# Patient Record
Sex: Female | Born: 1950 | Race: White | Hispanic: No | Smoking: Never smoker
Health system: Southern US, Community
[De-identification: ages and names within clinical notes are randomized; demographics above are authoritative.]

## PROBLEM LIST (undated history)

## (undated) DIAGNOSIS — R1310 Dysphagia, unspecified: Secondary | ICD-10-CM

## (undated) DIAGNOSIS — K449 Diaphragmatic hernia without obstruction or gangrene: Secondary | ICD-10-CM

## (undated) DIAGNOSIS — K649 Unspecified hemorrhoids: Secondary | ICD-10-CM

## (undated) DIAGNOSIS — K219 Gastro-esophageal reflux disease without esophagitis: Secondary | ICD-10-CM

## (undated) DIAGNOSIS — M1990 Unspecified osteoarthritis, unspecified site: Secondary | ICD-10-CM

## (undated) DIAGNOSIS — E039 Hypothyroidism, unspecified: Secondary | ICD-10-CM

## (undated) DIAGNOSIS — K5790 Diverticulosis of intestine, part unspecified, without perforation or abscess without bleeding: Secondary | ICD-10-CM

## (undated) DIAGNOSIS — M5430 Sciatica, unspecified side: Secondary | ICD-10-CM

## (undated) HISTORY — DX: Diverticulosis of intestine, part unspecified, without perforation or abscess without bleeding: K57.90

## (undated) HISTORY — DX: Diaphragmatic hernia without obstruction or gangrene: K44.9

## (undated) HISTORY — DX: Sciatica, unspecified side: M54.30

## (undated) HISTORY — DX: Unspecified osteoarthritis, unspecified site: M19.90

## (undated) HISTORY — DX: Gastro-esophageal reflux disease without esophagitis: K21.9

## (undated) HISTORY — DX: Dysphagia, unspecified: R13.10

## (undated) HISTORY — PX: ECTOPIC PREGNANCY SURGERY: SHX613

## (undated) HISTORY — PX: TUBAL LIGATION: SHX77

## (undated) HISTORY — DX: Hypothyroidism, unspecified: E03.9

## (undated) HISTORY — DX: Unspecified hemorrhoids: K64.9

---

## 2001-04-30 ENCOUNTER — Encounter: Admission: RE | Admit: 2001-04-30 | Discharge: 2001-04-30 | Payer: Self-pay | Admitting: *Deleted

## 2001-04-30 ENCOUNTER — Encounter: Payer: Self-pay | Admitting: *Deleted

## 2001-05-30 ENCOUNTER — Encounter (INDEPENDENT_AMBULATORY_CARE_PROVIDER_SITE_OTHER): Payer: Self-pay | Admitting: Specialist

## 2001-05-30 ENCOUNTER — Ambulatory Visit (HOSPITAL_COMMUNITY): Admission: RE | Admit: 2001-05-30 | Discharge: 2001-05-30 | Payer: Self-pay | Admitting: *Deleted

## 2005-12-12 DIAGNOSIS — K649 Unspecified hemorrhoids: Secondary | ICD-10-CM

## 2005-12-12 DIAGNOSIS — K5790 Diverticulosis of intestine, part unspecified, without perforation or abscess without bleeding: Secondary | ICD-10-CM

## 2005-12-12 HISTORY — DX: Unspecified hemorrhoids: K64.9

## 2005-12-12 HISTORY — DX: Diverticulosis of intestine, part unspecified, without perforation or abscess without bleeding: K57.90

## 2006-02-06 ENCOUNTER — Other Ambulatory Visit: Admission: RE | Admit: 2006-02-06 | Discharge: 2006-02-06 | Payer: Self-pay | Admitting: Family Medicine

## 2007-03-19 ENCOUNTER — Other Ambulatory Visit: Admission: RE | Admit: 2007-03-19 | Discharge: 2007-03-19 | Payer: Self-pay | Admitting: Family Medicine

## 2008-08-11 ENCOUNTER — Other Ambulatory Visit: Admission: RE | Admit: 2008-08-11 | Discharge: 2008-08-11 | Payer: Self-pay | Admitting: Family Medicine

## 2010-10-19 ENCOUNTER — Ambulatory Visit (HOSPITAL_COMMUNITY): Admission: RE | Admit: 2010-10-19 | Discharge: 2010-10-19 | Payer: Self-pay | Admitting: Family Medicine

## 2011-04-29 NOTE — Procedures (Signed)
[facility]  Patient:    Tina Mckinney, Tina Mckinney                     MRN: [id number] Proc. Date: 05/30/01 Adm. Date:  [id number] Attending:  Sabino Gasser CC:         Arlana Lindau, M.D.  Katherine Roan, M.D.   Procedure Report  ADDENDUM:  Send cc of original report to [facility] listed in header. DD:  05/30/01 TD:  05/30/01 Job: [id number]

## 2011-04-29 NOTE — Procedures (Signed)
[facility]  Patient:    Tina Mckinney, Tina Mckinney                     MRN: [id number] Proc. Date: 05/30/01 Adm. Date:  [id number] Attending:  Sabino Gasser                           Procedure Report  PROCEDURE:  Upper endoscopy.  INDICATION FOR PROCEDURE:  Reflux symptomatology.  ANESTHESIA:  Demerol 70, Versed 7 mg.  DESCRIPTION OF PROCEDURE:  With the patient mildly sedated in the left lateral decubitus position, the Olympus videoscopic endoscope was inserted in the mouth and passed under direct vision through the esophagus. There was a question of Barretts esophagus seen, photographed and biopsied. We entered into the stomach and there was a moderately enlarged hiatal hernia we saw and photographed. We advanced into the stomach, the fundus, body, antrum, duodenal bulb and second portion of the duodenum all were well visualized.  From this point, the endoscope was slowly withdrawn taking circumferential views of the entire duodenal mucosa until the endoscope was then pulled back into the stomach, placed in retroflexion to view the stomach from below and once again a hiatal hernia was seen.  The endoscope was straightened and withdrawn, taking circumferential views of the entire gastric and esophageal mucosa which otherwise appeared normal. The patients vital signs and pulse oximeter remained stable. The patient tolerated the procedure well and there were no apparent complications.  FINDINGS:  Hiatal hernia with question of Barretts esophagus. Await biopsy report. The patient will call me for results followup with me in as an outpatient. DD:  05/30/01 TD:  05/30/01 Job: [id number]

## 2011-09-14 ENCOUNTER — Other Ambulatory Visit (HOSPITAL_COMMUNITY): Payer: Self-pay | Admitting: Family Medicine

## 2011-10-05 ENCOUNTER — Other Ambulatory Visit (HOSPITAL_COMMUNITY): Payer: Self-pay | Admitting: Family Medicine

## 2011-10-05 DIAGNOSIS — Z1231 Encounter for screening mammogram for malignant neoplasm of breast: Secondary | ICD-10-CM

## 2011-10-27 ENCOUNTER — Ambulatory Visit (HOSPITAL_COMMUNITY)
Admission: RE | Admit: 2011-10-27 | Discharge: 2011-10-27 | Disposition: A | Discharge status: Home / Self Care | Payer: 59 | Source: Ambulatory Visit | Attending: Family Medicine | Admitting: Family Medicine

## 2011-10-27 DIAGNOSIS — Z1231 Encounter for screening mammogram for malignant neoplasm of breast: Secondary | ICD-10-CM

## 2012-04-12 ENCOUNTER — Other Ambulatory Visit: Payer: Self-pay

## 2012-04-12 ENCOUNTER — Other Ambulatory Visit: Payer: Self-pay | Admitting: Family Medicine

## 2012-04-12 ENCOUNTER — Other Ambulatory Visit (HOSPITAL_COMMUNITY)
Admission: RE | Admit: 2012-04-12 | Discharge: 2012-04-12 | Disposition: A | Discharge status: Home / Self Care | Payer: 59 | Source: Ambulatory Visit | Attending: Family Medicine | Admitting: Family Medicine

## 2012-04-12 DIAGNOSIS — R2232 Localized swelling, mass and lump, left upper limb: Secondary | ICD-10-CM

## 2012-04-12 DIAGNOSIS — Z01419 Encounter for gynecological examination (general) (routine) without abnormal findings: Secondary | ICD-10-CM | POA: Insufficient documentation

## 2012-04-19 ENCOUNTER — Ambulatory Visit
Admission: RE | Admit: 2012-04-19 | Discharge: 2012-04-19 | Disposition: A | Discharge status: Home / Self Care | Payer: PRIVATE HEALTH INSURANCE | Source: Ambulatory Visit | Attending: Family Medicine | Admitting: Family Medicine

## 2012-04-19 ENCOUNTER — Other Ambulatory Visit: Payer: Self-pay | Admitting: Family Medicine

## 2012-04-19 DIAGNOSIS — R2232 Localized swelling, mass and lump, left upper limb: Secondary | ICD-10-CM

## 2013-04-12 ENCOUNTER — Other Ambulatory Visit: Payer: Self-pay | Admitting: Family Medicine

## 2013-04-12 ENCOUNTER — Other Ambulatory Visit (HOSPITAL_COMMUNITY)
Admission: RE | Admit: 2013-04-12 | Discharge: 2013-04-12 | Disposition: A | Discharge status: Home / Self Care | Payer: 59 | Source: Ambulatory Visit | Attending: Family Medicine | Admitting: Family Medicine

## 2013-04-12 DIAGNOSIS — Z0000 Encounter for general adult medical examination without abnormal findings: Secondary | ICD-10-CM | POA: Insufficient documentation

## 2013-04-12 DIAGNOSIS — Z1231 Encounter for screening mammogram for malignant neoplasm of breast: Secondary | ICD-10-CM

## 2013-05-10 ENCOUNTER — Other Ambulatory Visit: Payer: Self-pay | Admitting: Gastroenterology

## 2013-05-10 DIAGNOSIS — K219 Gastro-esophageal reflux disease without esophagitis: Secondary | ICD-10-CM

## 2013-05-14 ENCOUNTER — Ambulatory Visit
Admission: RE | Admit: 2013-05-14 | Discharge: 2013-05-14 | Disposition: A | Discharge status: Home / Self Care | Payer: 59 | Source: Ambulatory Visit | Attending: Gastroenterology | Admitting: Gastroenterology

## 2013-05-14 DIAGNOSIS — K219 Gastro-esophageal reflux disease without esophagitis: Secondary | ICD-10-CM

## 2013-05-23 ENCOUNTER — Ambulatory Visit
Admission: RE | Admit: 2013-05-23 | Discharge: 2013-05-23 | Disposition: A | Discharge status: Home / Self Care | Payer: 59 | Source: Ambulatory Visit | Attending: Family Medicine | Admitting: Family Medicine

## 2013-05-23 DIAGNOSIS — Z1231 Encounter for screening mammogram for malignant neoplasm of breast: Secondary | ICD-10-CM

## 2014-04-18 ENCOUNTER — Other Ambulatory Visit (HOSPITAL_COMMUNITY)
Admission: RE | Admit: 2014-04-18 | Discharge: 2014-04-18 | Disposition: A | Discharge status: Home / Self Care | Payer: 59 | Source: Ambulatory Visit | Attending: Family Medicine | Admitting: Family Medicine

## 2014-04-18 ENCOUNTER — Other Ambulatory Visit: Payer: Self-pay | Admitting: Family Medicine

## 2014-04-18 DIAGNOSIS — Z0000 Encounter for general adult medical examination without abnormal findings: Secondary | ICD-10-CM | POA: Insufficient documentation

## 2014-05-08 ENCOUNTER — Other Ambulatory Visit (HOSPITAL_COMMUNITY): Payer: Self-pay | Admitting: Family Medicine

## 2014-05-08 DIAGNOSIS — Z1231 Encounter for screening mammogram for malignant neoplasm of breast: Secondary | ICD-10-CM

## 2014-05-29 ENCOUNTER — Ambulatory Visit
Admission: RE | Admit: 2014-05-29 | Discharge: 2014-05-29 | Disposition: A | Discharge status: Home / Self Care | Payer: 59 | Source: Ambulatory Visit | Attending: Family Medicine | Admitting: Family Medicine

## 2014-05-29 ENCOUNTER — Other Ambulatory Visit (HOSPITAL_COMMUNITY): Payer: Self-pay | Admitting: Family Medicine

## 2014-05-29 ENCOUNTER — Ambulatory Visit (HOSPITAL_COMMUNITY): Payer: 59

## 2014-05-29 DIAGNOSIS — Z1231 Encounter for screening mammogram for malignant neoplasm of breast: Secondary | ICD-10-CM

## 2014-07-11 ENCOUNTER — Other Ambulatory Visit: Payer: Self-pay | Admitting: Family Medicine

## 2014-07-11 ENCOUNTER — Ambulatory Visit
Admission: RE | Admit: 2014-07-11 | Discharge: 2014-07-11 | Disposition: A | Discharge status: Home / Self Care | Payer: 59 | Source: Ambulatory Visit | Attending: Family Medicine | Admitting: Family Medicine

## 2014-07-11 DIAGNOSIS — R059 Cough, unspecified: Secondary | ICD-10-CM

## 2014-07-11 DIAGNOSIS — R05 Cough: Secondary | ICD-10-CM

## 2015-07-03 ENCOUNTER — Other Ambulatory Visit (HOSPITAL_COMMUNITY): Payer: Self-pay | Admitting: Family Medicine

## 2015-07-03 DIAGNOSIS — Z1231 Encounter for screening mammogram for malignant neoplasm of breast: Secondary | ICD-10-CM

## 2015-07-16 ENCOUNTER — Ambulatory Visit (HOSPITAL_COMMUNITY): Payer: 59

## 2015-07-30 ENCOUNTER — Ambulatory Visit (HOSPITAL_COMMUNITY)
Admission: RE | Admit: 2015-07-30 | Discharge: 2015-07-30 | Disposition: A | Discharge status: Home / Self Care | Payer: 59 | Source: Ambulatory Visit | Attending: Family Medicine | Admitting: Family Medicine

## 2015-07-30 DIAGNOSIS — Z1231 Encounter for screening mammogram for malignant neoplasm of breast: Secondary | ICD-10-CM

## 2016-04-22 ENCOUNTER — Other Ambulatory Visit: Payer: Self-pay | Admitting: Family Medicine

## 2016-04-22 ENCOUNTER — Other Ambulatory Visit (HOSPITAL_COMMUNITY)
Admission: RE | Admit: 2016-04-22 | Discharge: 2016-04-22 | Disposition: A | Discharge status: Home / Self Care | Payer: BLUE CROSS/BLUE SHIELD | Source: Ambulatory Visit | Attending: Family Medicine | Admitting: Family Medicine

## 2016-04-22 DIAGNOSIS — Z01419 Encounter for gynecological examination (general) (routine) without abnormal findings: Secondary | ICD-10-CM | POA: Diagnosis not present

## 2016-04-26 LAB — CYTOLOGY - PAP

## 2016-07-29 ENCOUNTER — Other Ambulatory Visit: Payer: Self-pay | Admitting: Family Medicine

## 2016-07-29 DIAGNOSIS — Z1231 Encounter for screening mammogram for malignant neoplasm of breast: Secondary | ICD-10-CM

## 2016-08-08 ENCOUNTER — Ambulatory Visit
Admission: RE | Admit: 2016-08-08 | Discharge: 2016-08-08 | Disposition: A | Discharge status: Home / Self Care | Payer: BLUE CROSS/BLUE SHIELD | Source: Ambulatory Visit | Attending: Family Medicine | Admitting: Family Medicine

## 2016-08-08 DIAGNOSIS — Z1231 Encounter for screening mammogram for malignant neoplasm of breast: Secondary | ICD-10-CM

## 2016-11-21 ENCOUNTER — Ambulatory Visit (INDEPENDENT_AMBULATORY_CARE_PROVIDER_SITE_OTHER): Payer: BLUE CROSS/BLUE SHIELD | Admitting: Neurology

## 2016-11-21 ENCOUNTER — Encounter: Payer: Self-pay | Admitting: Neurology

## 2016-11-21 VITALS — BP 160/86 | HR 74 | Resp 20 | Ht 62.0 in | Wt 228.0 lb

## 2016-11-21 DIAGNOSIS — R42 Dizziness and giddiness: Secondary | ICD-10-CM

## 2016-11-21 DIAGNOSIS — R2689 Other abnormalities of gait and mobility: Secondary | ICD-10-CM

## 2016-11-21 DIAGNOSIS — H519 Unspecified disorder of binocular movement: Secondary | ICD-10-CM

## 2016-11-21 NOTE — Progress Notes (Signed)
Subjective:    Patient ID: Tina GuardianCynthia W Mckinney is a 65 y.o. female.  HPI     Huston FoleySaima Brenen Beigel, MD, PhD [facility] [address], [address] [address] [place], [place] [id number]  Dear Dr. Emily FilbertGould,  I saw your patient, Tina JewelCynthia Vokes, upon your kind request in my neurologic clinic today for initial consultation of her vertigo. The patient is accompanied by her friend, Annette StableBill, today. As you know, Ms. Tina Mckinney is a 65 year old right-handed woman with an underlying medical history of hiatal hernia, hemorrhoids, reflux disease, diverticulosis, and obesity, who reports difficulty with involuntary eye movements, associated with feeling of nausea and feeling of vertigo. Symptoms are intermittent. She had 3 episodes in the past year. Episodes are few minutes long, up to 15 minutes. She has been noted to have jumpy eye movements, rotating eye movements and describes room spinning or rotating, or the picture in front of her eyes appears to be jumping up and down. She has had some falls. She feels off-balance. Her original symptoms started in 2009 and she saw Dr. Sandria ManlyLove in this office at the time. She reports that she had a brain MRI which was negative at the time. She also [place]. She reports that she saw a total of 5 doctors at the time and did not have a clear answer to her symptoms. She was questioning at one point whether she had carbon monoxide poisoning. I believe, Dr. Loleta ChanceHill recommended ambulatory EEG but she did not have it done, she lost insurance for a while. She does not have daily symptoms, currently she does not have any vertiginous symptoms or headaches. She does not typically have any headaches but feels that her head swells up at times. She has a remote history of skull fracture at age 89. She has a sebaceous cyst on her right side of the head but it does not bother her. She denies snoring or apneas, likes to sleep on her stomach. She lives alone. She does  not drink water very much, about 3-4 glasses per day, typically 2 cups of coffee in the morning and sodas infrequently and tea infrequently, no alcohol, does not smoke. She has not had any outpatient physical therapy. I reviewed your office note from 11/11/2016. 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 More recently, she has had these visual symptoms. Your examination was in keeping with cataracts and decrease in visual acuity secondary to dense cataracts. She was not found to have any nystagmus, APD or visual field loss.  She also brings in a list of symptoms, she has experienced over the last years, since 2009, including shortness of breath, fatigue, malaise, dizziness, clumsiness, confusion, slurring of speech, nausea, chest pain, unusual emotional behavior and mood swings.   Her Past Medical History Is Significant For: Past Medical History:  Diagnosis Date  . Arthritis   . Diverticulosis 2007  . Dysphagia   . GERD (gastroesophageal reflux disease)   . Hemorrhoid 2007  . Hiatal hernia   . Hypothyroid   . Sciatica     Her Past Surgical History Is Significant For: Past Surgical History:  Procedure Laterality Date  . ECTOPIC PREGNANCY SURGERY    . TUBAL LIGATION      Her Family History Is Significant For: Family History  Problem Relation Age of Onset  . Heart failure Mother   . Hypertension Father   . Heart disease Father   .  Breast cancer Maternal Grandmother     Her Social History Is Significant For: Social History   Social History  . Marital status: Divorced    Spouse name: N/A  . Number of children: N/A  . Years of education: N/A   Social History Main Topics  . Smoking status: Never Smoker  . Smokeless tobacco: Never Used  . Alcohol use 1.2 oz/week    2 Standard drinks or equivalent per week  . Drug use: No  . Sexual activity: Not Asked   Other Topics Concern  . None   Social History Narrative  . None     Her Allergies Are:  No Known Allergies:   Her Current Medications Are:  Outpatient Encounter Prescriptions as of 11/21/2016  Medication Sig  . Cholecalciferol (VITAMIN D PO) Take by mouth.  . cyanocobalamin 2000 MCG tablet Take 2,000 mcg by mouth daily.  Marland Kitchen GLUCOSAMINE HCL PO Take by mouth.  . Glucosamine-Chondroitin (MOVE FREE PO) Take 2 tablets by mouth daily.  Marland Kitchen levothyroxine (SYNTHROID, LEVOTHROID) 75 MCG tablet Take 75 mcg by mouth daily.  . Magnesium 500 MG TABS Take 500 mg by mouth daily.  . naproxen sodium (ANAPROX) 220 MG tablet Take 660 mg by mouth 2 (two) times daily with a meal.  . [DISCONTINUED] B Complex Vitamins (VITAMIN B COMPLEX PO) Take by mouth.  . [DISCONTINUED] BIOTIN PO Take by mouth.  . [DISCONTINUED] esomeprazole (NEXIUM) 40 MG capsule Take 40 mg by mouth daily at 12 noon.  . [DISCONTINUED] GARCINIA CAMBOGIA-CHROMIUM PO Take by mouth.  . [DISCONTINUED] THYROID, PORK, PO Take by mouth.   No facility-administered encounter medications on file as of 11/21/2016.   : Review of Systems:  Out of a complete 14 point review of systems, all are reviewed and negative with the exception of these symptoms as listed below:  Review of Systems  Neurological: Positive for dizziness, speech difficulty, weakness and numbness.       Pt presents today with her significant other to discuss symptoms that have been present since 2009. Symptoms include dizziness, vertigo, and "rapid eye movements". Pt says that she feels "drunk" all of the time.    Objective:  Neurologic Exam  Physical Exam Physical Examination:   Vitals:   11/21/16 1011  BP: (!) 160/86  Pulse: 74  Resp: 20   General Examination: The patient is a very pleasant 65 y.o. female in no acute distress. She appears well-developed and well-nourished and well groomed.   HEENT: Normocephalic, atraumatic, pupils are equal, round and reactive to light and accommodation. Funduscopic exam is normal with sharp disc  margins noted. Extraocular tracking is good without limitation to gaze excursion or nystagmus noted. Normal smooth pursuit is noted. Hearing is grossly intact. Face is symmetric with normal facial animation and normal facial sensation. Speech is clear with no dysarthria noted. There is no hypophonia. There is no lip, neck/head, jaw or voice tremor. Neck is supple with full range of passive and active motion. There are no carotid bruits on auscultation. Oropharynx exam reveals: mild mouth dryness, adequate dental hygiene and mild airway crowding, due to smaller airway entry. Mallampati is class II. Tongue protrudes centrally and palate elevates symmetrically. Tonsils are absent. Denies lightheadedness or vertiginous symptoms.   Chest: Clear to auscultation without wheezing, rhonchi or crackles noted.  Heart: S1+S2+0, regular and normal without murmurs, rubs or gallops noted.   Abdomen: Soft, non-tender and non-distended with normal bowel sounds appreciated on auscultation.  Extremities: There is no pitting edema  in the distal lower extremities bilaterally. Pedal pulses are intact.  Skin: Warm and dry without trophic changes noted. There are no varicose veins, except very mild in the distal legs.  Musculoskeletal: exam reveals no obvious joint deformities, tenderness or joint swelling or erythema.   Neurologically:  Mental status: The patient is awake, alert and oriented in all 4 spheres. Her immediate and remote memory, attention, language skills and fund of knowledge are appropriate. There is no evidence of aphasia, agnosia, apraxia or anomia. Speech is clear with normal prosody and enunciation. Thought process is linear. Mood is normal and affect is normal.  Cranial nerves II - XII are as described above under HEENT exam. In addition: shoulder shrug is normal with equal shoulder height noted. Motor exam: Normal bulk, strength and tone is noted. There is no drift, tremor or rebound. Romberg is  negative, except for mild sway. Reflexes are 2+ throughout. Babinski: Toes are flexor bilaterally. Fine motor skills and coordination: intact with normal finger taps, normal hand movements, normal rapid alternating patting, normal foot taps and normal foot agility.  Cerebellar testing: No dysmetria or intention tremor on finger to nose testing. Heel to shin is unremarkable bilaterally. There is no truncal or gait ataxia.  Sensory exam: intact to light touch, pinprick, vibration, temperature sense in the upper and lower extremities.  Gait, station and balance: She stands easily. No veering to one side is noted. No leaning to one side is noted. Posture is age-appropriate and stance is narrow based. Gait shows normal stride length and normal pace. No problems turning are noted. Tandem walk is unremarkable. Intact toe and heel stance is noted.               Assessment and Plan:   In summary, Tina Mckinney is a very pleasant 65 y.o.-year old female with an underlying medical history of hiatal hernia, hemorrhoids, reflux disease, diverticulosis, and obesity, who reports difficulty with involuntary eye movements, associated with feeling of nausea and feeling of vertigo. Her history is most likely in keeping with intermittent vertigo. She did not have any symptoms today. In fact, her exam is largely nonfocal. She is reassured in that regard. She does have a number of other symptoms which she has intermittently experienced over the course of the past few years. She had previous workup and consultation with at least 2 other neurologists. She is advised that at this time I would recommend proceeding with a brain MRI with and without contrast because of recent onset of abnormal eye movements. In addition, for symptomatic help, I suggested a referral to physical therapy, in particular to pursue vestibular rehabilitation. I think she would benefit from this. We will call her with her MRI results. We are looking for any  structural reasons to account for her eye symptoms. Per your report, her recent eye exam was benign which is also reassuring. I explained to her that there is no specific medication that would help in her case from my end of things. Nevertheless, I did ask her to stay well hydrated with water, in fact increase her water intake, and change positions slowly. I answered all their questions today and the patient and her friend were in agreement with the above outlined plan. I would like to see the patient back in a few months for recheck. We will be in touch over the phone regarding her brain MRI results. She indicated that she will try to get records from [facility] as  well.  Thank you very much for allowing me to participate in the care of this nice patient. If I can be of any further assistance to you please do not hesitate to call me at [phone].  Sincerely,   Huston Foley, MD, PhD

## 2016-11-21 NOTE — Patient Instructions (Addendum)
Try to hydrate better with water: 6-8 glasses per day.   We will do a brain scan, called MRI and call you with the test results. We will have to schedule you for this on a separate date. This test requires authorization from your insurance, and we will take care of the insurance process. Please remember, that vertigo can recur without warning, triggers could be stress, sleep deprivation, dehydration and even taking a bumpy car ride, sometimes an acute illness, even a common (viral) cold. It can last hours or days. Please change positions slowly and always stay well-hydrated. Physical therapy with particular attention to vestibular rehabilitation can be very helpful. While there is no specific medication that helps with vertigo, some people get relief with as needed use of meclizine. Certain medications can exacerbate vertigo.   I will refer you to physical therapy at neuro rehab. They can work on your balance and gait issues and vertigo symptoms.

## 2016-11-24 ENCOUNTER — Telehealth: Payer: Self-pay | Admitting: Neurology

## 2016-11-24 NOTE — Telephone Encounter (Signed)
Patient is calling to discuss scheduling an MRI. I advised the order was sent to [facility] but she states she wants to have it at [facility] and would like a call back.

## 2016-11-24 NOTE — Telephone Encounter (Signed)
I called patient back and she informed me she wants it at [facility] I faxed the order to [facility].

## 2016-12-14 ENCOUNTER — Telehealth: Payer: Self-pay | Admitting: Neurology

## 2016-12-14 DIAGNOSIS — F40240 Claustrophobia: Secondary | ICD-10-CM

## 2016-12-14 MED ORDER — ALPRAZOLAM 0.5 MG PO TABS: ORAL_TABLET | ORAL | 0 refills | Status: AC

## 2016-12-14 NOTE — Telephone Encounter (Signed)
Rx faxed into requested pharmacy.

## 2016-12-14 NOTE — Telephone Encounter (Signed)
Patient is having an MRI on Friday and would like medication called in to relax her. Please call to [facility] on [address].

## 2016-12-14 NOTE — Telephone Encounter (Signed)
Xanax Rx placed for MRI, pls fax.

## 2016-12-14 NOTE — Addendum Note (Signed)
Addended by: Huston FoleyATHAR, Ric Rosenberg on: 12/14/2016 10:17 AM   Modules accepted: Orders

## 2016-12-16 DIAGNOSIS — R42 Dizziness and giddiness: Secondary | ICD-10-CM

## 2016-12-16 DIAGNOSIS — H519 Unspecified disorder of binocular movement: Secondary | ICD-10-CM

## 2016-12-23 ENCOUNTER — Ambulatory Visit (INDEPENDENT_AMBULATORY_CARE_PROVIDER_SITE_OTHER): Payer: Self-pay

## 2016-12-23 DIAGNOSIS — H519 Unspecified disorder of binocular movement: Secondary | ICD-10-CM

## 2016-12-23 DIAGNOSIS — R42 Dizziness and giddiness: Secondary | ICD-10-CM

## 2016-12-23 DIAGNOSIS — Z0289 Encounter for other administrative examinations: Secondary | ICD-10-CM

## 2016-12-23 DIAGNOSIS — R2689 Other abnormalities of gait and mobility: Secondary | ICD-10-CM

## 2016-12-26 NOTE — Progress Notes (Signed)
Please call patient regarding the recent brain MRI: The brain scan showed a normal structure of the brain and mild volume loss which we call atrophy. There were changes in the deeper structures of the brain, which we call white matter changes or microvascular changes. These were reported as mild in Her case. These are tiny white spots, that occur with time and are seen in a variety of conditions, including with normal aging, chronic hypertension, chronic headaches, especially migraine HAs, chronic diabetes, chronic hyperlipidemia. These are not strokes and no mass or lesion or contrast enhancement was seen which is reassuring.   Of note, she has some knots under the skin, but not intruding the skull on the sides of the head, both sides with mild prominence after contrast was given. I would recommend that she have this checked out by PCP and consider seeing a dermatologist. These nodules could be scar tissue or fatty tissue. Does not affect the bone or the brain, but was reportedly seen on the scan and reported as scar like, thicker connective tissue like appearance.  Again, there were no acute findings, such as a stroke, or mass or blood products. No further action is required on this test at this time, other than re-enforcing the importance of good blood pressure control, good cholesterol control, good blood sugar control, and weight management. Please remind patient to keep any upcoming appointments or tests and to call us with any interim questions, concerns, problems or updates. Thanks,  Huston FoleySaima Lowell Mcgurk, MD, PhD

## 2016-12-27 ENCOUNTER — Telehealth: Payer: Self-pay

## 2016-12-27 NOTE — Telephone Encounter (Signed)
-----   Message from Huston FoleySaima Athar, MD sent at 12/26/2016  2:20 PM EST ----- Please call patient regarding the recent brain MRI: The brain scan showed a normal structure of the brain and mild volume loss which we call atrophy. There were changes in the deeper structures of the brain, which we call white matter changes or microvascular changes. These were reported as mild in Her case. These are tiny white spots, that occur with time and are seen in a variety of conditions, including with normal aging, chronic hypertension, chronic headaches, especially migraine HAs, chronic diabetes, chronic hyperlipidemia. These are not strokes and no mass or lesion or contrast enhancement was seen which is reassuring.   Of note, she has some knots under the skin, but not intruding the skull on the sides of the head, both sides with mild prominence after contrast was given. I would recommend that she have this checked out by PCP and consider seeing a dermatologist. These nodules could be scar tissue or fatty tissue. Does not affect the bone or the brain, but was reportedly seen on the scan and reported as scar like, thicker connective tissue like appearance.  Again, there were no acute findings, such as a stroke, or mass or blood products. No further action is required on this test at this time, other than re-enforcing the importance of good blood pressure control, good cholesterol control, good blood sugar control, and weight management. Please remind patient to keep any upcoming appointments or tests and to call us with any interim questions, concerns, problems or updates. Thanks,  Huston FoleySaima Athar, MD, PhD

## 2016-12-27 NOTE — Telephone Encounter (Signed)
I spoke to the patient and she is aware of the results and recommendations. She voiced understanding.

## 2017-03-22 ENCOUNTER — Ambulatory Visit: Payer: BLUE CROSS/BLUE SHIELD | Admitting: Neurology

## 2017-09-04 ENCOUNTER — Other Ambulatory Visit: Payer: Self-pay | Admitting: Family Medicine

## 2017-09-04 DIAGNOSIS — Z1231 Encounter for screening mammogram for malignant neoplasm of breast: Secondary | ICD-10-CM

## 2017-09-15 ENCOUNTER — Ambulatory Visit
Admission: RE | Admit: 2017-09-15 | Discharge: 2017-09-15 | Disposition: A | Discharge status: Home / Self Care | Payer: BLUE CROSS/BLUE SHIELD | Source: Ambulatory Visit | Attending: Family Medicine | Admitting: Family Medicine

## 2017-09-15 DIAGNOSIS — Z1231 Encounter for screening mammogram for malignant neoplasm of breast: Secondary | ICD-10-CM

## 2018-03-19 ENCOUNTER — Other Ambulatory Visit (HOSPITAL_COMMUNITY)
Admission: RE | Admit: 2018-03-19 | Discharge: 2018-03-19 | Disposition: A | Discharge status: Home / Self Care | Payer: Medicare Other | Source: Ambulatory Visit | Attending: Family Medicine | Admitting: Family Medicine

## 2018-03-19 ENCOUNTER — Other Ambulatory Visit: Payer: Self-pay | Admitting: Family Medicine

## 2018-03-19 DIAGNOSIS — E039 Hypothyroidism, unspecified: Secondary | ICD-10-CM | POA: Diagnosis not present

## 2018-03-19 DIAGNOSIS — Z124 Encounter for screening for malignant neoplasm of cervix: Secondary | ICD-10-CM | POA: Diagnosis present

## 2018-03-19 DIAGNOSIS — E7800 Pure hypercholesterolemia, unspecified: Secondary | ICD-10-CM | POA: Diagnosis not present

## 2018-03-19 DIAGNOSIS — Z1211 Encounter for screening for malignant neoplasm of colon: Secondary | ICD-10-CM | POA: Diagnosis not present

## 2018-03-19 DIAGNOSIS — Z23 Encounter for immunization: Secondary | ICD-10-CM | POA: Diagnosis not present

## 2018-03-19 DIAGNOSIS — Z0000 Encounter for general adult medical examination without abnormal findings: Secondary | ICD-10-CM | POA: Diagnosis not present

## 2018-03-19 DIAGNOSIS — F418 Other specified anxiety disorders: Secondary | ICD-10-CM | POA: Diagnosis not present

## 2018-03-19 DIAGNOSIS — Z136 Encounter for screening for cardiovascular disorders: Secondary | ICD-10-CM | POA: Diagnosis not present

## 2018-03-21 LAB — CYTOLOGY - PAP
Diagnosis: NEGATIVE
HPV: NOT DETECTED

## 2018-05-21 DIAGNOSIS — K5730 Diverticulosis of large intestine without perforation or abscess without bleeding: Secondary | ICD-10-CM | POA: Diagnosis not present

## 2018-05-21 DIAGNOSIS — Z1211 Encounter for screening for malignant neoplasm of colon: Secondary | ICD-10-CM | POA: Diagnosis not present

## 2018-06-04 DIAGNOSIS — H2513 Age-related nuclear cataract, bilateral: Secondary | ICD-10-CM | POA: Diagnosis not present

## 2018-07-13 DIAGNOSIS — H2513 Age-related nuclear cataract, bilateral: Secondary | ICD-10-CM | POA: Diagnosis not present

## 2018-07-13 DIAGNOSIS — H5201 Hypermetropia, right eye: Secondary | ICD-10-CM | POA: Diagnosis not present

## 2018-07-13 DIAGNOSIS — H524 Presbyopia: Secondary | ICD-10-CM | POA: Diagnosis not present

## 2018-08-14 DIAGNOSIS — H2512 Age-related nuclear cataract, left eye: Secondary | ICD-10-CM | POA: Diagnosis not present

## 2018-08-14 DIAGNOSIS — H25812 Combined forms of age-related cataract, left eye: Secondary | ICD-10-CM | POA: Diagnosis not present

## 2018-09-10 DIAGNOSIS — H25811 Combined forms of age-related cataract, right eye: Secondary | ICD-10-CM | POA: Diagnosis not present

## 2018-09-10 DIAGNOSIS — H2511 Age-related nuclear cataract, right eye: Secondary | ICD-10-CM | POA: Diagnosis not present

## 2018-10-12 DIAGNOSIS — H35351 Cystoid macular degeneration, right eye: Secondary | ICD-10-CM | POA: Diagnosis not present

## 2018-10-18 ENCOUNTER — Other Ambulatory Visit: Payer: Self-pay | Admitting: Family Medicine

## 2018-10-18 DIAGNOSIS — Z1231 Encounter for screening mammogram for malignant neoplasm of breast: Secondary | ICD-10-CM

## 2018-11-30 ENCOUNTER — Ambulatory Visit
Admission: RE | Admit: 2018-11-30 | Discharge: 2018-11-30 | Disposition: A | Discharge status: Home / Self Care | Payer: Medicare Other | Source: Ambulatory Visit | Attending: Family Medicine | Admitting: Family Medicine

## 2018-11-30 DIAGNOSIS — Z1231 Encounter for screening mammogram for malignant neoplasm of breast: Secondary | ICD-10-CM

## 2019-12-26 IMAGING — MG DIGITAL SCREENING BILATERAL MAMMOGRAM WITH TOMO AND CAD
6 of 9 series · 6 of 25 positions shown · non-contrast
Comparison: Previous exam(s).

CLINICAL DATA: Screening.

EXAM:
DIGITAL SCREENING BILATERAL MAMMOGRAM WITH TOMO AND CAD

[L CC synth-2D]
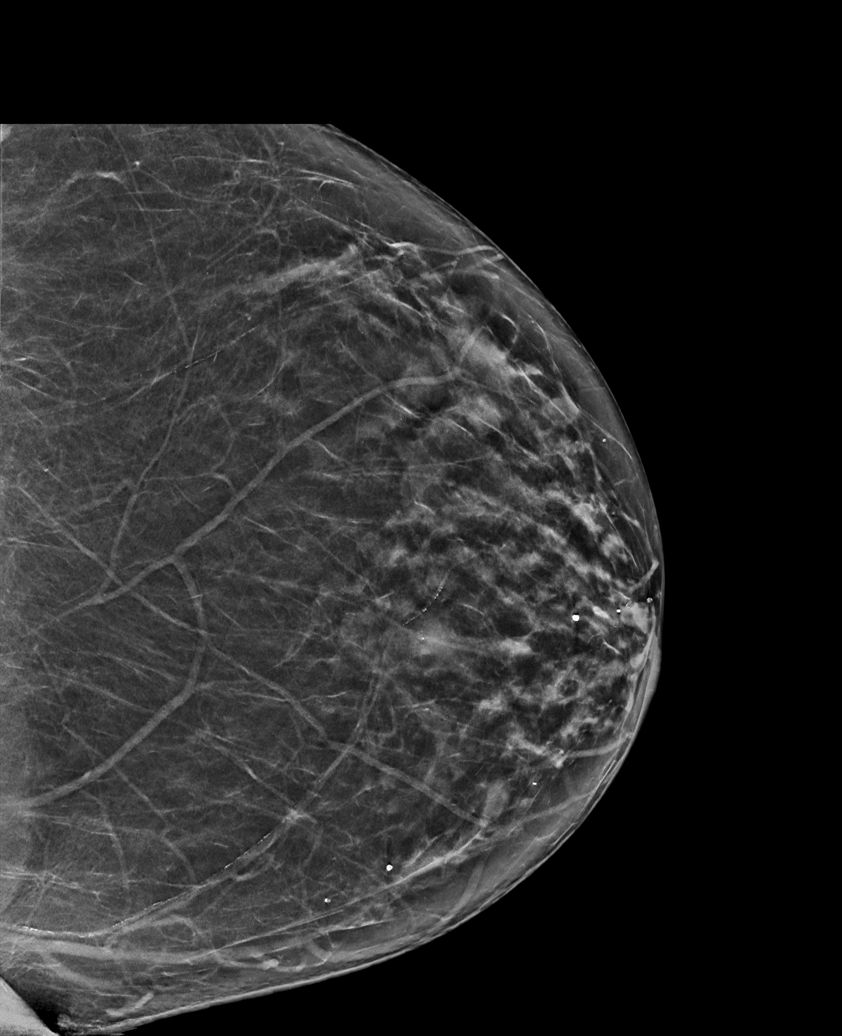

[R CC synth-2D (1 of 2)]
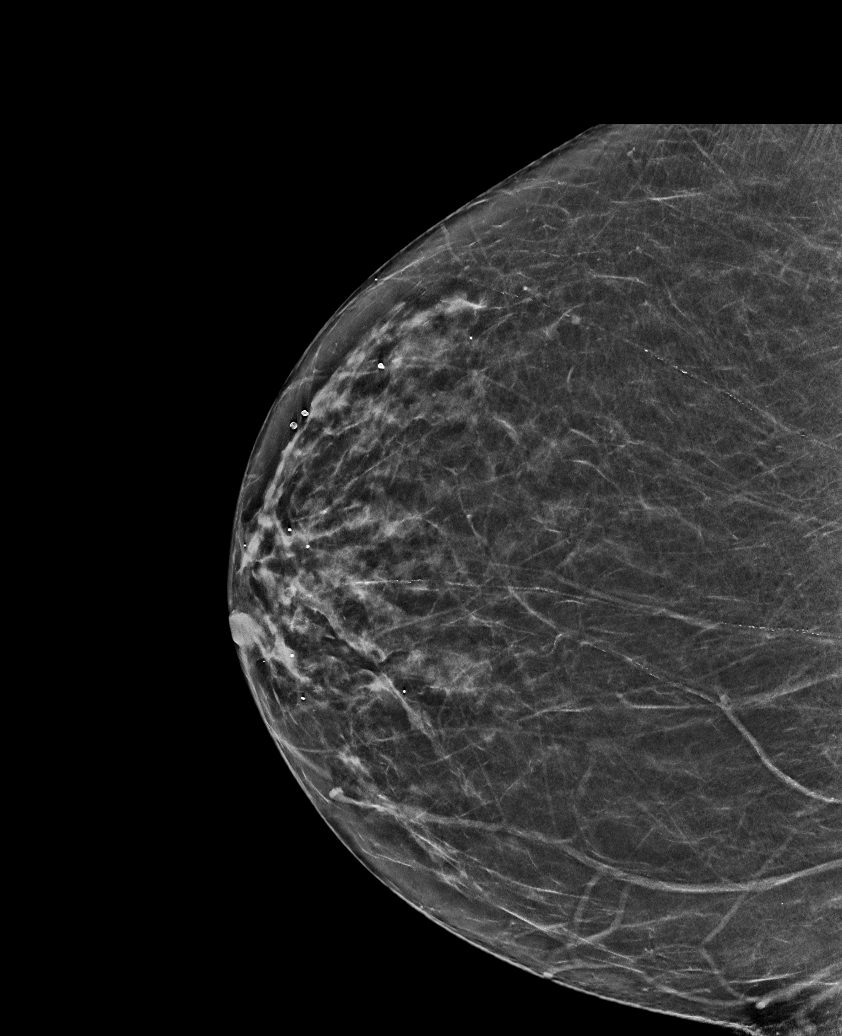

[L MLO synth-2D]
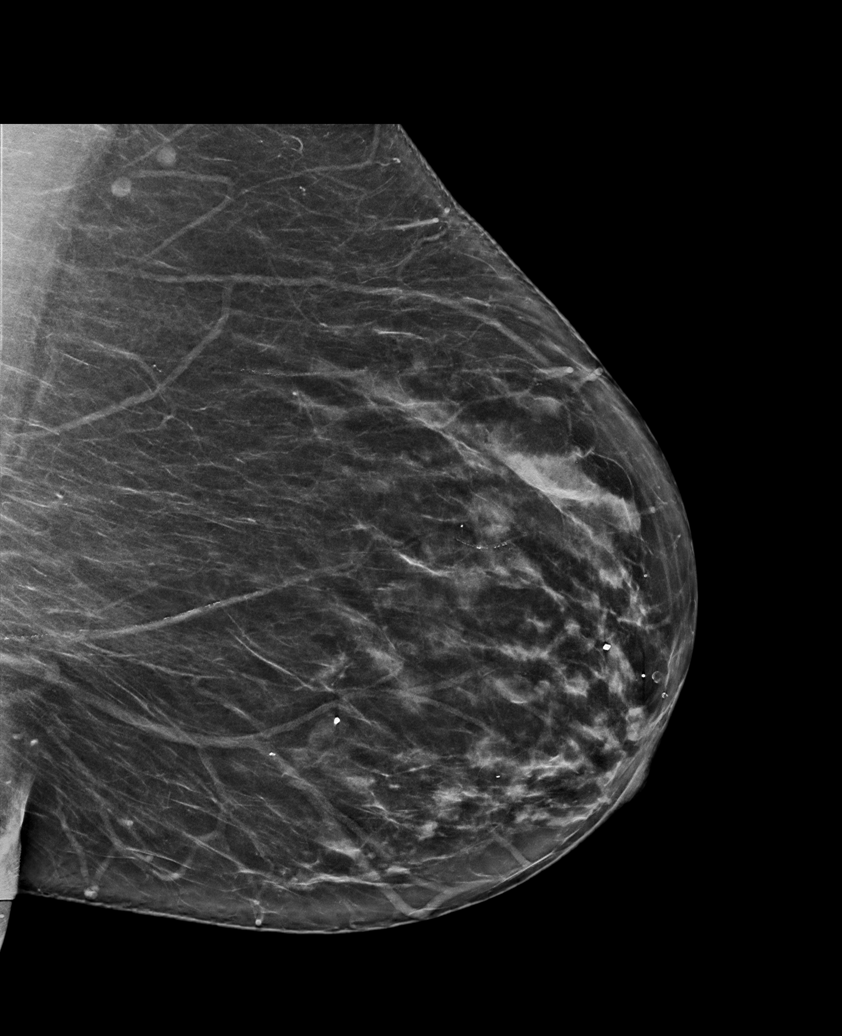

[R CC synth-2D (2 of 2)]
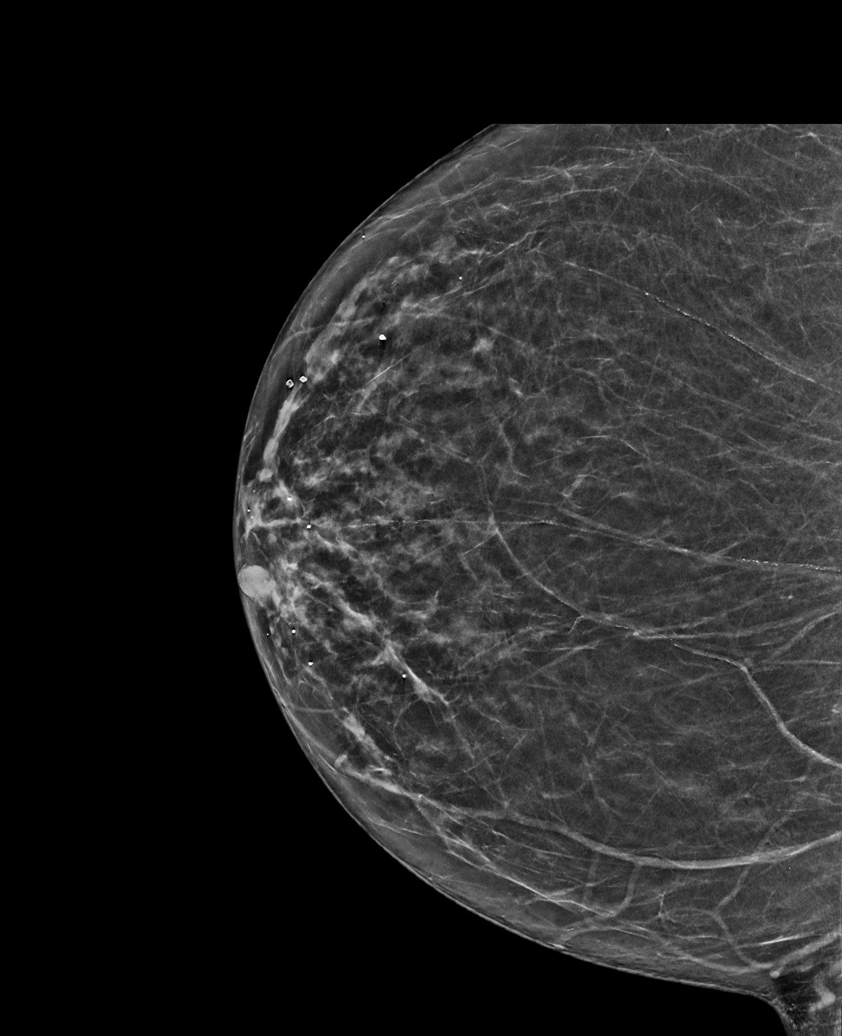

[R MLO synth-2D]
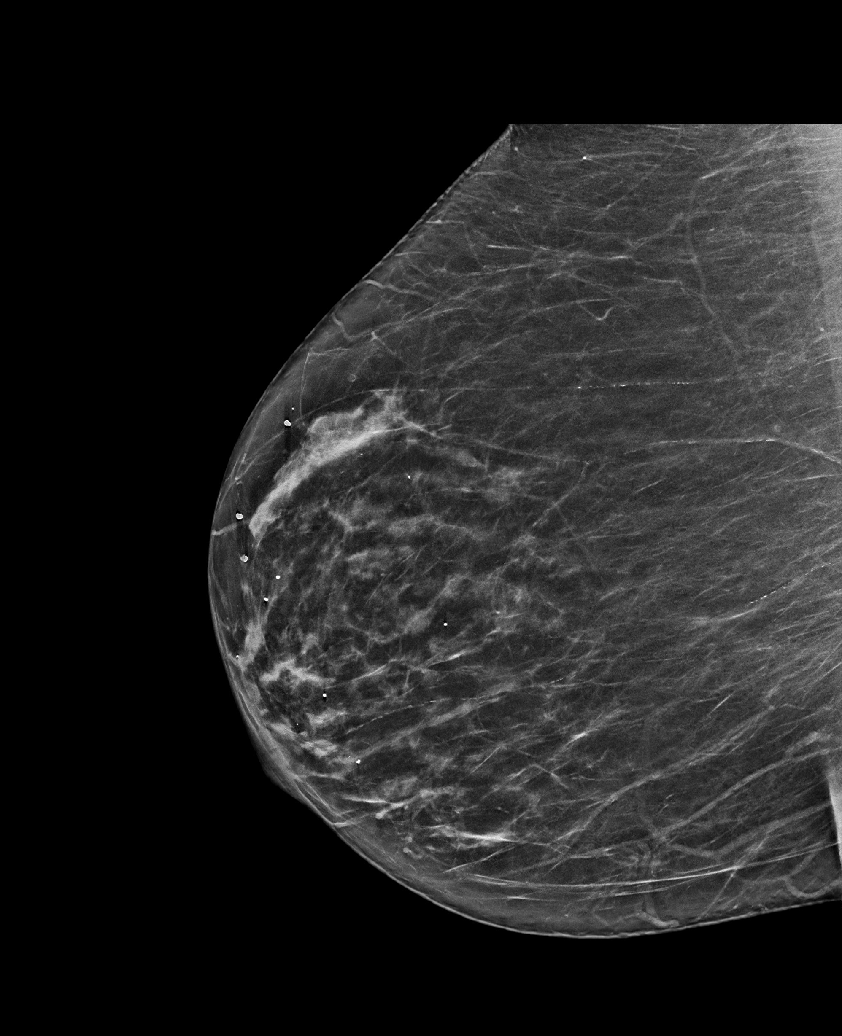

[L MLO tomo · tomo slice 39/78.0]
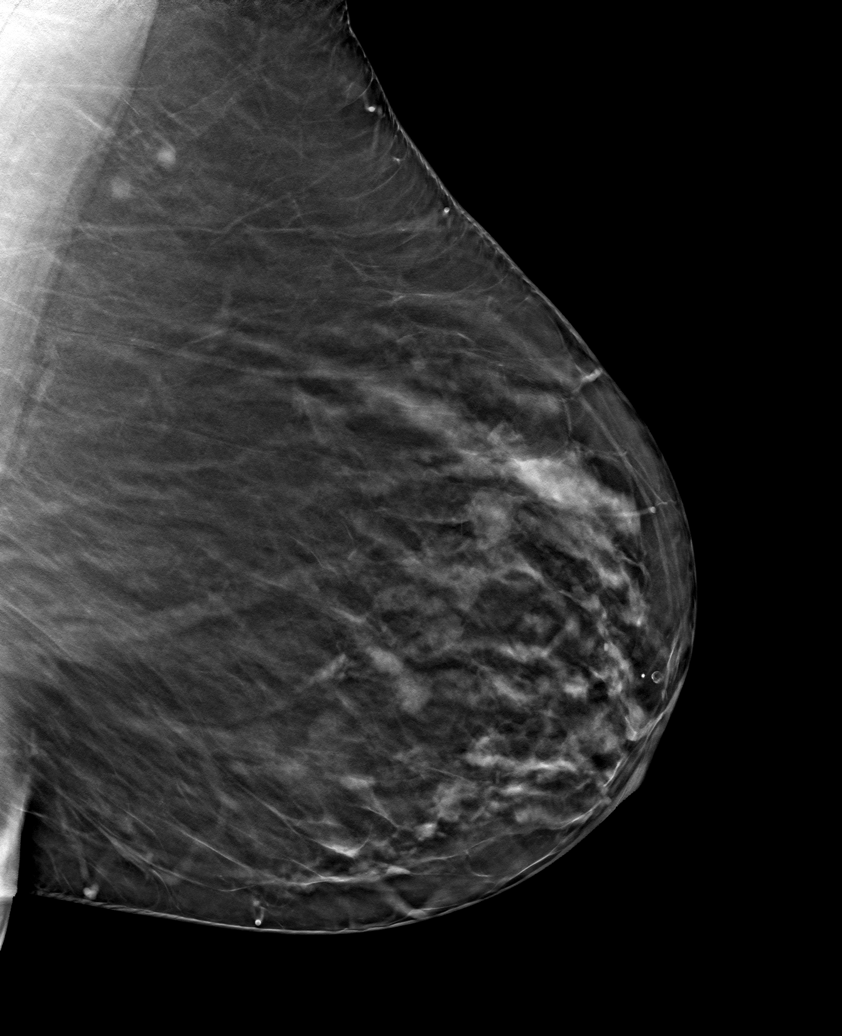

[6 of 25 positions shown; findings below may reference images not displayed]

ACR Breast Density Category b: There are scattered areas of
fibroglandular density.
FINDINGS: There are no findings suspicious for malignancy. Images were
processed with CAD.
IMPRESSION: No mammographic evidence of malignancy. A result letter of this
screening mammogram will be mailed directly to the patient.

RECOMMENDATION:
Screening mammogram in one year. (Code:[id number])

BI-RADS CATEGORY  1: Negative.

## 2020-05-04 ENCOUNTER — Other Ambulatory Visit: Payer: Self-pay | Admitting: Family Medicine

## 2020-05-04 DIAGNOSIS — Z1231 Encounter for screening mammogram for malignant neoplasm of breast: Secondary | ICD-10-CM

## 2020-05-15 ENCOUNTER — Other Ambulatory Visit: Payer: Self-pay

## 2020-05-15 ENCOUNTER — Ambulatory Visit
Admission: RE | Admit: 2020-05-15 | Discharge: 2020-05-15 | Disposition: A | Discharge status: Home / Self Care | Payer: Medicare Other | Source: Ambulatory Visit | Attending: Family Medicine | Admitting: Family Medicine

## 2020-05-15 DIAGNOSIS — Z1231 Encounter for screening mammogram for malignant neoplasm of breast: Secondary | ICD-10-CM

## 2021-05-24 ENCOUNTER — Other Ambulatory Visit: Payer: Self-pay | Admitting: Family Medicine

## 2021-05-24 DIAGNOSIS — Z1231 Encounter for screening mammogram for malignant neoplasm of breast: Secondary | ICD-10-CM

## 2021-07-19 ENCOUNTER — Ambulatory Visit: Payer: Medicare Other

## 2021-08-26 ENCOUNTER — Ambulatory Visit
Admission: RE | Admit: 2021-08-26 | Discharge: 2021-08-26 | Disposition: A | Discharge status: Home / Self Care | Payer: Medicare Other | Source: Ambulatory Visit | Attending: Family Medicine | Admitting: Family Medicine

## 2021-08-26 DIAGNOSIS — Z1231 Encounter for screening mammogram for malignant neoplasm of breast: Secondary | ICD-10-CM

## 2022-09-28 ENCOUNTER — Other Ambulatory Visit: Payer: Self-pay | Admitting: Geriatric Medicine

## 2022-09-28 DIAGNOSIS — Z1231 Encounter for screening mammogram for malignant neoplasm of breast: Secondary | ICD-10-CM

## 2022-10-05 ENCOUNTER — Ambulatory Visit
Admission: RE | Admit: 2022-10-05 | Discharge: 2022-10-05 | Disposition: A | Discharge status: Home / Self Care | Payer: Medicare Other | Source: Ambulatory Visit | Attending: Geriatric Medicine | Admitting: Geriatric Medicine

## 2022-10-05 DIAGNOSIS — Z1231 Encounter for screening mammogram for malignant neoplasm of breast: Secondary | ICD-10-CM

## 2023-11-30 ENCOUNTER — Other Ambulatory Visit: Payer: Self-pay | Admitting: Family Medicine

## 2023-11-30 DIAGNOSIS — Z1231 Encounter for screening mammogram for malignant neoplasm of breast: Secondary | ICD-10-CM

## 2023-12-27 ENCOUNTER — Ambulatory Visit
Admission: RE | Admit: 2023-12-27 | Discharge: 2023-12-27 | Disposition: A | Discharge status: Home / Self Care | Payer: Medicare Other | Source: Ambulatory Visit | Attending: Family Medicine | Admitting: Family Medicine

## 2023-12-27 DIAGNOSIS — Z1231 Encounter for screening mammogram for malignant neoplasm of breast: Secondary | ICD-10-CM

## 2024-11-29 ENCOUNTER — Other Ambulatory Visit: Payer: Self-pay | Admitting: Family Medicine

## 2024-11-29 DIAGNOSIS — Z1231 Encounter for screening mammogram for malignant neoplasm of breast: Secondary | ICD-10-CM

## 2025-01-22 ENCOUNTER — Encounter
